# Patient Record
Sex: Male | Born: 2005 | Race: Black or African American | Hispanic: No | Marital: Single | Smoking: Never smoker
Health system: Southern US, Community
[De-identification: ages and names within clinical notes are randomized; demographics above are authoritative.]

## PROBLEM LIST (undated history)

## (undated) DIAGNOSIS — J45909 Unspecified asthma, uncomplicated: Secondary | ICD-10-CM

---
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
## 2024-04-16 ENCOUNTER — Encounter (HOSPITAL_COMMUNITY): Payer: Self-pay

## 2024-04-16 ENCOUNTER — Other Ambulatory Visit: Payer: Self-pay

## 2024-04-16 ENCOUNTER — Emergency Department (HOSPITAL_COMMUNITY)
Admission: EM | Admit: 2024-04-16 | Discharge: 2024-04-16 | Disposition: A | Discharge status: Home / Self Care | Attending: Emergency Medicine | Admitting: Emergency Medicine

## 2024-04-16 DIAGNOSIS — R519 Headache, unspecified: Secondary | ICD-10-CM | POA: Diagnosis present

## 2024-04-16 DIAGNOSIS — J069 Acute upper respiratory infection, unspecified: Secondary | ICD-10-CM | POA: Diagnosis not present

## 2024-04-16 LAB — RESP PANEL BY RT-PCR (RSV, FLU A&B, COVID)  RVPGX2
Influenza A by PCR: NEGATIVE
Influenza B by PCR: NEGATIVE
Resp Syncytial Virus by PCR: NEGATIVE
SARS Coronavirus 2 by RT PCR: NEGATIVE

## 2024-04-16 LAB — GROUP A STREP BY PCR: Group A Strep by PCR: NOT DETECTED

## 2024-04-16 MED ORDER — BENZONATATE 100 MG PO CAPS: 100.0000 mg | ORAL_CAPSULE | Freq: Three times a day (TID) | ORAL | 0 refills | Status: DC

## 2024-04-16 MED ORDER — BENZONATATE 100 MG PO CAPS: 100.0000 mg | ORAL_CAPSULE | Freq: Three times a day (TID) | ORAL | 0 refills | Status: AC

## 2024-04-16 NOTE — ED Provider Triage Note (Signed)
 Emergency Medicine Provider Triage Evaluation Note  Craig Garza , a 18 y.o. male  was evaluated in triage.  Pt complains of headache and URI symptoms began yesterday.  Was seen at PCP yesterday and diagnosed with a virus.  Sister has same symptoms  Review of Systems  Positive: Cough, sore throat, headache Negative: Fever, chills, nausea, vomiting, shortness of breath, chest pain  Physical Exam  Ht 5' 11 (1.803 m)   BMI 17.96 kg/m  Gen:   Awake, no distress   Resp:  Normal effort  MSK:   Moves extremities without difficulty  Other:    Medical Decision Making  Medically screening exam initiated at 3:12 PM.  Appropriate orders placed.  Craig Garza was informed that the remainder of the evaluation will be completed by another provider, this initial triage assessment does not replace that evaluation, and the importance of remaining in the ED until their evaluation is complete.  Labs ordered   Francis Ileana SAILOR, PA-C 04/16/24 1513

## 2024-04-16 NOTE — Discharge Instructions (Signed)
Your work-up in the ER today was reassuring for acute findings. You were swabbed for COVID, flu, RSV, and strep which were negative. However, your symptoms are still likely related to an upper respiratory infection. As these are almost always viral in nature, no antibiotics are indicated. I recommend that you get plenty of rest and focus on symptomatic relief which includes Cepacol throat lozenges for sore throat, Mucinex D (orange box) which you can get from behind the counter at your local pharmacy for congestion, and tylenol/ibuprofen as needed for fevers and bodyaches. I have also given you a prescription for tessalon which is a cough medication for you to take as prescribed as needed for management of your symptoms. I also recommend:  Increased fluid intake. Sports drinks offer valuable electrolytes, sugars, and fluids.  Breathing heated mist or steam (vaporizer or shower).  Eating chicken soup or other clear broths, and maintaining good nutrition.   Increasing usage of your inhaler if you have asthma.  Return to work when your temperature has returned to normal.  Gargle warm salt water and spit it out for sore throat. Take benadryl or Zyrtec to decrease sinus secretions.  Follow Up: Follow up with your primary care doctor in 5-7 days for recheck of ongoing symptoms.  Return to emergency department for emergent changing or worsening of symptoms.

## 2024-04-16 NOTE — ED Triage Notes (Signed)
 Patient arrives POV for sore threat and headache. Patient endorses this pain began yesterday.

## 2024-04-16 NOTE — ED Triage Notes (Signed)
 Pt c/o SOB, cough, and sore throat.  Pt was seen at PCP yesterday for same and diagnosed w/ a virus.   Pt's sister has the same symptoms.

## 2024-04-16 NOTE — ED Provider Notes (Signed)
 [facility] [facility] Provider Note   CSN: [id number] Arrival date & time: 04/16/24  1502     Patient presents with: Headache and Sore Throat   Craig Garza is a 18 y.o. male.   Patient with no pertinent past medical history presents today with complaints of sore throat. Reports that symptoms started yesterday evening.  He has not taken anything for his symptoms.  Reports that his sister is home sick with similar symptoms which started with a sore throat and then she began having cough and congestion as well. The patient denies any current cough or congestion.  No fevers or chills.  Reports that his mom wanted him to come in to be tested for strep.  His sister had a negative strep swab yesterday.  He is eating and drinking normally and tolerating secretions.  The history is provided by the patient. No language interpreter was used.  Headache Associated symptoms: sore throat   Sore Throat       Prior to Admission medications   Medication Sig Start Date End Date Taking? Authorizing Provider  cetirizine HCl (ZYRTEC) 1 MG/ML solution Take 10 mg by mouth daily. 03/06/22   [provider]  cloNIDine (CATAPRES) 0.1 MG tablet Take 0.1 mg by mouth 2 (two) times daily. 02/01/18   [provider]  dexmethylphenidate (FOCALIN XR) 15 MG 24 hr capsule Take 15 mg by mouth daily. 05/31/18   [provider]  montelukast (SINGULAIR) 5 MG chewable tablet Chew 5 mg by mouth at bedtime. 02/01/18   [provider]  oseltamivir  (TAMIFLU ) 75 MG capsule Take 1 capsule (75 mg total) by mouth 2 (two) times daily. 06/08/18   Vicky Charleston, PA-C  polyethylene glycol powder (GLYCOLAX/MIRALAX) 17 GM/SCOOP powder Take 1 Container by mouth daily. 01/05/22   [provider]  risperiDONE (RISPERDAL) 0.5 MG tablet Take 0.5 mg by mouth 2 (two) times daily. 02/01/18   [provider]    Allergies: Patient has no known allergies.     Review of Systems  HENT:  Positive for sore throat.   All other systems reviewed and are negative.   Updated Vital Signs BP 118/80 (BP Location: Right Arm)   Pulse 80   Temp 98.4 F (36.9 C) (Oral)   Resp 16   Ht 5' 11 (1.803 m)   SpO2 98%   BMI 17.96 kg/m   Physical Exam Vitals and nursing note reviewed.  Constitutional:      General: He is not in acute distress.    Appearance: Normal appearance. He is well-developed and normal weight. He is not ill-appearing, toxic-appearing or diaphoretic.  HENT:     Head: Normocephalic and atraumatic.     Mouth/Throat:     Mouth: Mucous membranes are moist.     Pharynx: Uvula midline.     Tonsils: No tonsillar exudate or tonsillar abscesses. 0 on the right. 0 on the left.  Neck:     Comments: No meningismus Cardiovascular:     Rate and Rhythm: Normal rate and regular rhythm.     Heart sounds: Normal heart sounds.  Pulmonary:     Effort: Pulmonary effort is normal. No respiratory distress.     Breath sounds: Normal breath sounds.  Abdominal:     Palpations: Abdomen is soft.     Tenderness: There is no abdominal tenderness.  Musculoskeletal:        General: Normal range of motion.     Cervical back: Normal range of  motion and neck supple.  Lymphadenopathy:     Cervical: No cervical adenopathy.  Skin:    General: Skin is warm and dry.  Neurological:     General: No focal deficit present.     Mental Status: He is alert.  Psychiatric:        Mood and Affect: Mood normal.        Behavior: Behavior normal.     (all labs ordered are listed, but only abnormal results are displayed) Labs Reviewed  RESP PANEL BY RT-PCR (RSV, FLU A&B, COVID)  [id number]  GROUP A STREP BY PCR    EKG: None  Radiology: No results found.   Procedures   Medications Ordered in the ED - No data to display                                  Medical Decision Making  Patient presents today with complaints of sore throat x 1 day.  They are  afebrile, nontoxic-appearing, and in no acute distress with reassuring vital signs.  Physical exam reveals lung sounds clear to auscultation in all fields.  Patient is tolerating secretions with normal phonation without tonsillar swelling or exudate.  Uvula is midline.  No indication for CXR imaging at this time. Patient negative for COVID, flu, RSV, and strep.  However, patient's symptoms likely due to URI, likely viral etiology.  Discussed with patient that there is no indication for antibiotics for viral infections. Will send for tessalon  for cough as needed if this develops with OTC recommendations. Recommend close outpatient follow-up with return precautions. Evaluation and diagnostic testing in the emergency department does not suggest an emergent condition requiring admission or immediate intervention beyond what has been performed at this time.  Plan for discharge with close PCP follow-up.  Patient is understanding and amenable with plan, educated on red flag symptoms that would prompt immediate return.  Patient discharged in stable condition.  Final diagnoses:  Viral URI    ED Discharge Orders          Ordered    benzonatate  (TESSALON ) 100 MG capsule  Every 8 hours        04/16/24 1816          An After Visit Summary was printed and given to the patient.      Nora Lauraine DELENA DEVONNA 04/16/24 [id number]    Onwh, Gndylj H, MD 04/19/24 [phone]
